# Patient Record
Sex: Male | Born: 2000 | Race: White | Hispanic: No | Marital: Married | Smoking: Never smoker
Health system: Southern US, Community
[De-identification: ages and names within clinical notes are randomized; demographics above are authoritative.]

## PROBLEM LIST (undated history)

## (undated) DIAGNOSIS — K219 Gastro-esophageal reflux disease without esophagitis: Secondary | ICD-10-CM

## (undated) DIAGNOSIS — F419 Anxiety disorder, unspecified: Secondary | ICD-10-CM

## (undated) DIAGNOSIS — A770 Spotted fever due to Rickettsia rickettsii: Secondary | ICD-10-CM

## (undated) HISTORY — PX: TEAR DUCT PROBING: SHX793

---
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
## 2018-02-16 ENCOUNTER — Emergency Department: Payer: Managed Care, Other (non HMO)

## 2018-02-16 ENCOUNTER — Emergency Department
Admission: EM | Admit: 2018-02-16 | Discharge: 2018-02-16 | Disposition: A | Discharge status: Home / Self Care | Payer: Managed Care, Other (non HMO) | Attending: Emergency Medicine | Admitting: Emergency Medicine

## 2018-02-16 DIAGNOSIS — Y999 Unspecified external cause status: Secondary | ICD-10-CM | POA: Insufficient documentation

## 2018-02-16 DIAGNOSIS — Y92410 Unspecified street and highway as the place of occurrence of the external cause: Secondary | ICD-10-CM | POA: Insufficient documentation

## 2018-02-16 DIAGNOSIS — Y939 Activity, unspecified: Secondary | ICD-10-CM | POA: Insufficient documentation

## 2018-02-16 DIAGNOSIS — M79605 Pain in left leg: Secondary | ICD-10-CM | POA: Insufficient documentation

## 2018-02-16 DIAGNOSIS — Z79899 Other long term (current) drug therapy: Secondary | ICD-10-CM | POA: Diagnosis not present

## 2018-02-16 DIAGNOSIS — S92512A Displaced fracture of proximal phalanx of left lesser toe(s), initial encounter for closed fracture: Secondary | ICD-10-CM | POA: Insufficient documentation

## 2018-02-16 DIAGNOSIS — S99922A Unspecified injury of left foot, initial encounter: Secondary | ICD-10-CM | POA: Diagnosis present

## 2018-02-16 MED ORDER — HYDROCODONE-ACETAMINOPHEN 5-325 MG PO TABS: 1.0000 | ORAL_TABLET | ORAL | 0 refills | Status: DC | PRN

## 2018-02-16 MED ORDER — HYDROCODONE-ACETAMINOPHEN 5-325 MG PO TABS
1.0000 | ORAL_TABLET | Freq: Once | ORAL | Status: AC
  Administered 2018-02-16: 1 via ORAL
  Filled 2018-02-16: qty 1
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
## 2018-02-16 NOTE — ED Notes (Signed)
Pt discharged to home.  Family member driving.  Discharge instructions reviewed.  Verbalized understanding.  No questions or concerns at this time.  Teach back verified.  Pt in NAD.  No items left in ED.   
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
## 2020-08-15 ENCOUNTER — Emergency Department: Payer: Managed Care, Other (non HMO)

## 2020-08-15 ENCOUNTER — Emergency Department
Admission: EM | Admit: 2020-08-15 | Discharge: 2020-08-15 | Disposition: A | Discharge status: Home / Self Care | Payer: Managed Care, Other (non HMO) | Attending: Emergency Medicine | Admitting: Emergency Medicine

## 2020-08-15 ENCOUNTER — Encounter: Payer: Self-pay | Admitting: Intensive Care

## 2020-08-15 ENCOUNTER — Other Ambulatory Visit: Payer: Self-pay | Admitting: Radiology

## 2020-08-15 ENCOUNTER — Other Ambulatory Visit: Payer: Self-pay

## 2020-08-15 DIAGNOSIS — R112 Nausea with vomiting, unspecified: Secondary | ICD-10-CM | POA: Diagnosis not present

## 2020-08-15 DIAGNOSIS — F17220 Nicotine dependence, chewing tobacco, uncomplicated: Secondary | ICD-10-CM | POA: Diagnosis not present

## 2020-08-15 DIAGNOSIS — R1031 Right lower quadrant pain: Secondary | ICD-10-CM | POA: Insufficient documentation

## 2020-08-15 DIAGNOSIS — K2900 Acute gastritis without bleeding: Secondary | ICD-10-CM | POA: Diagnosis not present

## 2020-08-15 DIAGNOSIS — R109 Unspecified abdominal pain: Secondary | ICD-10-CM

## 2020-08-15 DIAGNOSIS — N5082 Scrotal pain: Secondary | ICD-10-CM

## 2020-08-15 DIAGNOSIS — N50811 Right testicular pain: Secondary | ICD-10-CM | POA: Diagnosis present

## 2020-08-15 HISTORY — DX: Anxiety disorder, unspecified: F41.9

## 2020-08-15 LAB — COMPREHENSIVE METABOLIC PANEL
ALT: 20 U/L (ref 0–44)
AST: 18 U/L (ref 15–41)
Albumin: 4.2 g/dL (ref 3.5–5.0)
Alkaline Phosphatase: 48 U/L (ref 38–126)
Anion gap: 12 (ref 5–15)
BUN: 10 mg/dL (ref 6–20)
CO2: 25 mmol/L (ref 22–32)
Calcium: 8.9 mg/dL (ref 8.9–10.3)
Chloride: 104 mmol/L (ref 98–111)
Creatinine, Ser: 0.94 mg/dL (ref 0.61–1.24)
GFR calc Af Amer: >60 mL/min (ref >60)
GFR calc non Af Amer: >60 mL/min (ref >60)
Glucose, Bld: 96 mg/dL (ref 70–99)
Potassium: 3.4 mmol/L — ABNORMAL LOW (ref 3.5–5.1)
Sodium: 141 mmol/L (ref 135–145)
Total Bilirubin: 0.9 mg/dL (ref 0.3–1.2)
Total Protein: 7 g/dL (ref 6.5–8.1)

## 2020-08-15 LAB — CBC WITH DIFFERENTIAL/PLATELET
Abs Immature Granulocytes: 0.03 10*3/uL (ref 0.00–0.07)
Basophils Absolute: 0 10*3/uL (ref 0.0–0.1)
Basophils Relative: 1 %
Eosinophils Absolute: 0.2 10*3/uL (ref 0.0–0.5)
Eosinophils Relative: 2 %
HCT: 40 % (ref 39.0–52.0)
Hemoglobin: 13.9 g/dL (ref 13.0–17.0)
Immature Granulocytes: 0 %
Lymphocytes Relative: 35 %
Lymphs Abs: 2.9 10*3/uL (ref 0.7–4.0)
MCH: 31.2 pg (ref 26.0–34.0)
MCHC: 34.8 g/dL (ref 30.0–36.0)
MCV: 89.7 fL (ref 80.0–100.0)
Monocytes Absolute: 0.7 10*3/uL (ref 0.1–1.0)
Monocytes Relative: 8 %
Neutro Abs: 4.5 10*3/uL (ref 1.7–7.7)
Neutrophils Relative %: 54 %
Platelets: 180 10*3/uL (ref 150–400)
RBC: 4.46 MIL/uL (ref 4.22–5.81)
RDW: 12.5 % (ref 11.5–15.5)
WBC: 8.3 10*3/uL (ref 4.0–10.5)
nRBC: 0 % (ref 0.0–0.2)

## 2020-08-15 LAB — URINALYSIS, COMPLETE (UACMP) WITH MICROSCOPIC
Bacteria, UA: NONE SEEN
Bilirubin Urine: NEGATIVE
Glucose, UA: NEGATIVE mg/dL
Hgb urine dipstick: NEGATIVE
Ketones, ur: 20 mg/dL — AB
Leukocytes,Ua: NEGATIVE
Nitrite: NEGATIVE
Protein, ur: NEGATIVE mg/dL
Specific Gravity, Urine: 1.018 (ref 1.005–1.030)
Squamous Epithelial / LPF: NONE SEEN (ref 0–5)
pH: 6 (ref 5.0–8.0)

## 2020-08-15 MED ORDER — KETOROLAC TROMETHAMINE 30 MG/ML IJ SOLN
15.0000 mg | Freq: Once | INTRAMUSCULAR | Status: AC
Start: 2020-08-15 — End: 2020-08-15
  Administered 2020-08-15: 19:00:00 15 mg via INTRAVENOUS
  Filled 2020-08-15: qty 1

## 2020-08-15 MED ORDER — ONDANSETRON 4 MG PO TBDP: 4.0000 mg | ORAL_TABLET | Freq: Three times a day (TID) | ORAL | 0 refills | Status: AC | PRN

## 2020-08-15 MED ORDER — PANTOPRAZOLE SODIUM 40 MG PO TBEC: 40.0000 mg | DELAYED_RELEASE_TABLET | Freq: Two times a day (BID) | ORAL | 0 refills | Status: AC

## 2020-08-15 MED ORDER — IOHEXOL 300 MG/ML  SOLN
100.0000 mL | Freq: Once | INTRAMUSCULAR | Status: AC | PRN
  Administered 2020-08-15: 20:00:00 100 mL via INTRAVENOUS
  Filled 2020-08-15: qty 100

## 2020-08-15 MED ORDER — METHOCARBAMOL 500 MG PO TABS: 500.0000 mg | ORAL_TABLET | Freq: Three times a day (TID) | ORAL | 0 refills | Status: AC | PRN

## 2020-08-15 MED ORDER — SODIUM CHLORIDE 0.9 % IV BOLUS
1000.0000 mL | Freq: Once | INTRAVENOUS | Status: AC
  Administered 2020-08-15: 19:00:00 1000 mL via INTRAVENOUS

## 2020-08-15 MED ORDER — SUCRALFATE 1 G PO TABS: 1.0000 g | ORAL_TABLET | Freq: Four times a day (QID) | ORAL | 0 refills | Status: AC

## 2020-08-15 NOTE — ED Triage Notes (Signed)
Patient c/o right side pain that radiates down to right tesicle, causing testicle pain. Denies swelling. Reports urinary difficulties. [facility] sent patient here  With concern for possible pinched nerves in low back causing abdominal and groin pain.

## 2020-08-15 NOTE — ED Provider Notes (Signed)
[facility] Emergency Department Provider Note  ____________________________________________   First MD Initiated Contact with Patient 08/15/20 1732     (approximate)  I have reviewed the triage vital signs and the nursing notes.   HISTORY  Chief Complaint Testicle Pain (right)    HPI Seth Scott is a 19 y.o. male  With PMHx below here with right sided abd pain. Pt reports that over the past 3 weeks, he's had ongoing, worsening R flank and RUQ abd pain, radiating towards his RLQ and flank. He's had associated urinary frequency and urgency, as well as weight loss due to poor appetite. He states he has had intermittent n/v, btu often in the AMs. No constipation but he feels his BM have been looser than usual. No recent med changes. No recent travel. Has not tried anything or seen anyone for this.        Past Medical History:  Diagnosis Date  . Anxiety   . GERD (gastroesophageal reflux disease)   . [facility] spotted fever     There are no problems to display for this patient.   Past Surgical History:  Procedure Laterality Date  . TEAR DUCT PROBING      Prior to Admission medications   Medication Sig Start Date End Date Taking? Authorizing Provider  diazepam (VALIUM) 5 MG tablet Take 1 tablet (5 mg total) by mouth every 8 (eight) hours as needed for anxiety. Patient not taking: Reported on 02/16/2018 06/07/15   Emily Filbert, MD  HYDROcodone-acetaminophen (NORCO/VICODIN) 5-325 MG tablet Take 1 tablet by mouth every 4 (four) hours as needed. 02/16/18   Minna Antis, MD  methocarbamol (ROBAXIN) 500 MG tablet Take 1 tablet (500 mg total) by mouth every 8 (eight) hours as needed for up to 7 days for muscle spasms (or flank pain). 08/15/20 08/22/20  Shaune Pollack, MD  ondansetron (ZOFRAN ODT) 4 MG disintegrating tablet Take 1 tablet (4 mg total) by mouth every 8 (eight) hours as needed for nausea or vomiting. 08/15/20   Shaune Pollack, MD  pantoprazole (PROTONIX) 40 MG tablet Take 1 tablet (40 mg total) by mouth 2 (two) times daily for 7 days. 08/15/20 08/22/20  Shaune Pollack, MD  propranolol (INDERAL) 20 MG tablet Take 20 mg by mouth 2 (two) times daily. 02/04/18   [provider]  QUEtiapine (SEROQUEL) 200 MG tablet Take 200 mg by mouth at bedtime. 02/04/18   [provider]  sertraline (ZOLOFT) 100 MG tablet Take 100 mg by mouth daily. 02/04/18   [provider]  sucralfate (CARAFATE) 1 g tablet Take 1 tablet (1 g total) by mouth 4 (four) times daily for 7 days. 08/15/20 08/22/20  Shaune Pollack, MD    Allergies Prevacid [lansoprazole], Valium [diazepam], and Sulfa antibiotics  History reviewed. No pertinent family history.  Social History Social History   Tobacco Use  . Smoking status: Never Smoker  . Smokeless tobacco: Current User    Types: Snuff  Substance Use Topics  . Alcohol use: Yes    Comment: rare  . Drug use: Yes    Types: Marijuana    Review of Systems  Review of Systems  Constitutional: Positive for fatigue. Negative for chills and fever.  HENT: Negative for sore throat.   Respiratory: Negative for shortness of breath.   Cardiovascular: Negative for chest pain.  Gastrointestinal: Positive for abdominal pain, nausea and vomiting.  Genitourinary: Positive for dysuria, flank pain and frequency.  Musculoskeletal: Negative for neck pain.  Skin: Negative for rash and wound.  Allergic/Immunologic: Negative for immunocompromised state.  Neurological: Negative for weakness and numbness.  Hematological: Does not bruise/bleed easily.  All other systems reviewed and are negative.    ____________________________________________  PHYSICAL EXAM:      VITAL SIGNS: ED Triage Vitals  Enc Vitals Group     BP 08/15/20 1556 (!) 163/77     Pulse Rate 08/15/20 1556 66     Resp 08/15/20 1556 18     Temp 08/15/20 1556 98.6 F (37 C)     Temp Source 08/15/20 1556 Oral      SpO2 08/15/20 1556 100 %     Weight 08/15/20 1558 146 lb (66.2 kg)     Height 08/15/20 1558 5\' 11"  (1.803 m)     Head Circumference --      Peak Flow --      Pain Score 08/15/20 1557 4     Pain Loc --      Pain Edu? --      Excl. in GC? --      Physical Exam Vitals and nursing note reviewed.  Constitutional:      General: He is not in acute distress.    Appearance: He is well-developed.  HENT:     Head: Normocephalic and atraumatic.  Eyes:     Conjunctiva/sclera: Conjunctivae normal.  Cardiovascular:     Rate and Rhythm: Normal rate and regular rhythm.     Heart sounds: Normal heart sounds. No murmur heard.  No friction rub.  Pulmonary:     Effort: Pulmonary effort is normal. No respiratory distress.     Breath sounds: Normal breath sounds. No wheezing or rales.  Abdominal:     General: Abdomen is flat. There is no distension.     Palpations: Abdomen is soft.     Tenderness: There is abdominal tenderness (RUQ, RLQ).  Musculoskeletal:     Cervical back: Neck supple.  Skin:    General: Skin is warm.     Capillary Refill: Capillary refill takes less than 2 seconds.  Neurological:     Mental Status: He is alert and oriented to person, place, and time.     Motor: No abnormal muscle tone.       ____________________________________________   LABS (all labs ordered are listed, but only abnormal results are displayed)  Labs Reviewed  URINALYSIS, COMPLETE (UACMP) WITH MICROSCOPIC - Abnormal; Notable for the following components:      Result Value   Color, Urine YELLOW (*)    APPearance CLEAR (*)    Ketones, ur 20 (*)    All other components within normal limits  COMPREHENSIVE METABOLIC PANEL - Abnormal; Notable for the following components:   Potassium 3.4 (*)    All other components within normal limits  CBC WITH DIFFERENTIAL/PLATELET  HIV ANTIBODY (ROUTINE TESTING W REFLEX)    ____________________________________________  EKG:  None ________________________________________  RADIOLOGY All imaging, including plain films, CT scans, and ultrasounds, independently reviewed by me, and interpretations confirmed via formal radiology reads.  ED MD interpretation:   None  Official radiology report(s): CT ABDOMEN PELVIS W CONTRAST  Result Date: 08/15/2020 CLINICAL DATA:  Right-sided flank pain. Nausea and vomiting. Urinary difficulties. EXAM: CT ABDOMEN AND PELVIS WITH CONTRAST TECHNIQUE: Multidetector CT imaging of the abdomen and pelvis was performed using the standard protocol following bolus administration of intravenous contrast. CONTRAST:  08/17/2020 OMNIPAQUE IOHEXOL 300 MG/ML  SOLN COMPARISON:  None. FINDINGS: Lower chest: Lung bases are clear. Hepatobiliary:  No focal liver abnormality is seen. No gallstones, gallbladder wall thickening, or biliary dilatation. Pancreas: Unremarkable. No pancreatic ductal dilatation or surrounding inflammatory changes. Spleen: Normal in size without focal abnormality. Adrenals/Urinary Tract: No adrenal gland nodules. Kidneys are symmetrical with homogeneous nephrograms. No hydronephrosis or hydroureter. Bladder is decompressed. Stomach/Bowel: Stomach is within normal limits. Appendix appears normal. No evidence of bowel wall thickening, distention, or inflammatory changes. Vascular/Lymphatic: No significant vascular findings are present. No enlarged abdominal or pelvic lymph nodes. Reproductive: Prostate gland is not enlarged. Other: Minimal free fluid in the pelvis of nonspecific etiology. Calcified phleboliths. No free air. Abdominal wall musculature appears intact. Musculoskeletal: Minimal spondylolisthesis at L5-S1. IMPRESSION: No acute process demonstrated in the abdomen or pelvis. No evidence of bowel obstruction or inflammation. Minimal free fluid in the pelvis of nonspecific etiology. Electronically Signed   By: Burman NievesWilliam  Stevens M.D.   On: 08/15/2020 20:08   US SCROTUM W/DOPPLER  Result  Date: 08/15/2020 CLINICAL DATA:  Right-sided testicular pain for 1 day. EXAM: SCROTAL ULTRASOUND DOPPLER ULTRASOUND OF THE TESTICLES TECHNIQUE: Complete ultrasound examination of the testicles, epididymis, and other scrotal structures was performed. Color and spectral Doppler ultrasound were also utilized to evaluate blood flow to the testicles. COMPARISON:  None. FINDINGS: Right testicle Measurements: 4.6 x 2.3 x 3.0 cm. Symmetric and homogeneous echotexture without focal lesion. Patent arterial and venous blood flow. Left testicle Measurements: 4.7 x 2.4 x 3.3 cm. Symmetric and homogeneous echotexture without focal lesion. Patent arterial and venous blood flow. Right epididymis:  Normal in size and appearance. Left epididymis:  Normal in size and appearance. Hydrocele:  None visualized. Varicocele:  None visualized. Pulsed Doppler interrogation of both testes demonstrates normal low resistance arterial and venous waveforms bilaterally. IMPRESSION: Normal scrotal ultrasound examination. No testicular mass lesions and patent intratesticular blood flow bilaterally. Electronically Signed   By: Rudie MeyerP.  Gallerani M.D.   On: 08/15/2020 16:59    ____________________________________________  PROCEDURES   Procedure(s) performed (including Critical Care):  Procedures  ____________________________________________  INITIAL IMPRESSION / MDM / ASSESSMENT AND PLAN / ED COURSE  As part of my medical decision making, I reviewed the following data within the electronic MEDICAL RECORD NUMBER Nursing notes reviewed and incorporated, Old chart reviewed, Notes from prior ED visits, and [place] Controlled Substance Database       *Clista BernhardtChase Daniel Barna was evaluated in Emergency Department on 08/16/2020 for the symptoms described in the history of present illness. He was evaluated in the context of the global COVID-19 pandemic, which necessitated consideration that the patient might be at risk for infection with the SARS-CoV-2 virus  that causes COVID-19. Institutional protocols and algorithms that pertain to the evaluation of patients at risk for COVID-19 are in a state of rapid change based on information released by regulatory bodies including the CDC and federal and state organizations. These policies and algorithms were followed during the patient's care in the ED.  Some ED evaluations and interventions may be delayed as a result of limited staffing during the pandemic.*     Medical Decision Making:  19 yo  M here with R flank pain radiating towards groin, as well as several months of intermittent n/v with waking up and worsening reflux. HDS and well appearing on arrival. Re: flank pain, suspect this is MSK in etiology and it began after he fell at home. He has no radiation down his legs, no perianal or perineal anesthesia, no loss of bowel/bladder fxn, or other signs of cauda equina or central cord  compression. UA without hematuria, pyuria. He is mildly dehydrated which could explain why he has a slight discomfort w/ urinating at times. CT scan obtained shows no signs of stone, appendicitis, mass, or other abnormality. Will tx supportively.  Regarding pt's nausea and burning chest discomfort, suspect GERD with possible underlying PUD. No signs of bleeding. He has a chronic h/o similar sx. Will refer to GI as he c/o n/v every AM, discomfort, and even some weight loss.   ____________________________________________  FINAL CLINICAL IMPRESSION(S) / ED DIAGNOSES  Final diagnoses:  Acute superficial gastritis without hemorrhage  Flank pain     MEDICATIONS GIVEN DURING THIS VISIT:  Medications  sodium chloride 0.9 % bolus 1,000 mL (0 mLs Intravenous Stopped 08/15/20 2137)  ketorolac (TORADOL) 30 MG/ML injection 15 mg (15 mg Intravenous Given 08/15/20 1848)  iohexol (OMNIPAQUE) 300 MG/ML solution 100 mL (100 mLs Intravenous Contrast Given 08/15/20 1950)     ED Discharge Orders         Ordered    pantoprazole (PROTONIX) 40  MG tablet  2 times daily        08/15/20 2128    sucralfate (CARAFATE) 1 g tablet  4 times daily        08/15/20 2128    methocarbamol (ROBAXIN) 500 MG tablet  Every 8 hours PRN        08/15/20 2128    ondansetron (ZOFRAN ODT) 4 MG disintegrating tablet  Every 8 hours PRN        08/15/20 2129    Ambulatory referral to Gastroenterology        08/15/20 2130           Note:  This document was prepared using Dragon voice recognition software and may include unintentional dictation errors.   Shaune Pollack, MD 08/16/20 [phone]

## 2020-08-15 NOTE — ED Notes (Signed)
Pt reports pain in right lower back pain that radiates into right lower abd into right testicle Pt has had N/V x2-3 days only in the morning

## 2020-08-16 LAB — HIV ANTIBODY (ROUTINE TESTING W REFLEX): HIV Screen 4th Generation wRfx: NONREACTIVE

## 2020-10-03 ENCOUNTER — Other Ambulatory Visit: Payer: Self-pay

## 2020-10-08 ENCOUNTER — Encounter: Payer: Self-pay | Admitting: *Deleted

## 2020-10-08 ENCOUNTER — Ambulatory Visit: Payer: Managed Care, Other (non HMO) | Admitting: Gastroenterology

## 2020-10-08 NOTE — Progress Notes (Deleted)
Gastroenterology Consultation  Referring Provider:     Shaune Pollack, MD Primary Care Physician:  Armando Gang, FNP Primary Gastroenterologist:  Dr. Servando Snare     Reason for Consultation:     Nausea and vomiting        HPI:   Seth Scott is a 19 y.o. y/o male referred for consultation & management of nausea and vomiting by Dr. Clint Guy, Fernand Parkins, FNP.  This patient comes to see me after being seen in May of last year at [facility] GI.  At that time the patient had reported to have an unremarkable CBC, CMP, lipid panel, thyroid profile, Neg H pylori IgM Ab, neg celiac disease panel, neg GI PCR, and neg Crohn's IBDX Prognostic panel.  The patient was recommended to follow-up in 6 weeks with the concern of possible Crohn's despite the above negative tests reported.  It appears that the patient did not follow-up at that time with [facility] clinic and also did not undergo the recommended colonoscopy.  The patient was in the emergency room with testicular pain in September and had mentioned some chest discomfort with morning nausea and vomiting therefore a GI consult was placed with me today.  In the emergency room room the patient was also reporting some abdominal discomfort with flank pain on the right.   Past Medical History:  Diagnosis Date  . Anxiety   . GERD (gastroesophageal reflux disease)   . [place] spotted fever     Past Surgical History:  Procedure Laterality Date  . TEAR DUCT PROBING      Prior to Admission medications   Medication Sig Start Date End Date Taking? Authorizing Provider  ondansetron (ZOFRAN ODT) 4 MG disintegrating tablet Take 1 tablet (4 mg total) by mouth every 8 (eight) hours as needed for nausea or vomiting. 08/15/20   Shaune Pollack, MD  pantoprazole (PROTONIX) 40 MG tablet Take 1 tablet (40 mg total) by mouth 2 (two) times daily for 7 days. 08/15/20 08/22/20  Shaune Pollack, MD  QUEtiapine (SEROQUEL) 200 MG tablet Take 1 tablet by mouth  at bedtime. 08/08/18   [provider]  sucralfate (CARAFATE) 1 g tablet Take 1 tablet (1 g total) by mouth 4 (four) times daily for 7 days. 08/15/20 08/22/20  Shaune Pollack, MD    No family history on file.   Social History   Tobacco Use  . Smoking status: Never Smoker  . Smokeless tobacco: Current User    Types: Snuff  Substance Use Topics  . Alcohol use: Yes    Comment: rare  . Drug use: Yes    Types: Marijuana    Allergies as of 10/08/2020 - Review Complete 08/15/2020  Allergen Reaction Noted  . Prevacid [lansoprazole] Swelling 06/07/2015  . Valium [diazepam] Hives 02/16/2018  . Sulfa antibiotics Rash 06/07/2015  . Sulfur Rash 06/10/2015    Review of Systems:    All systems reviewed and negative except where noted in HPI.   Physical Exam:  There were no vitals taken for this visit. No LMP for male patient. General:   Alert,  Well-developed, well-nourished, pleasant and cooperative in NAD Head:  Normocephalic and atraumatic. Eyes:  Sclera clear, no icterus.   Conjunctiva pink. Ears:  Normal auditory acuity. Neck:  Supple; no masses or thyromegaly. Lungs:  Respirations even and unlabored.  Clear throughout to auscultation.   No wheezes, crackles, or rhonchi. No acute distress. Heart:  Regular rate and rhythm; no murmurs, clicks, rubs, or gallops. Abdomen:  Normal bowel sounds.  No bruits.  Soft, non-tender and non-distended without masses, hepatosplenomegaly or hernias noted.  No guarding or rebound tenderness.  Negative Carnett sign.   Rectal:  Deferred.  Pulses:  Normal pulses noted. Extremities:  No clubbing or edema.  No cyanosis. Neurologic:  Alert and oriented x3;  grossly normal neurologically. Skin:  Intact without significant lesions or rashes.  No jaundice. Lymph Nodes:  No significant cervical adenopathy. Psych:  Alert and cooperative. Normal mood and affect.  Imaging Studies: No results found.  Assessment and Plan:   Seth Scott is a  19 y.o. y/o male ***    Midge Minium, MD. Clementeen Graham    Note: This dictation was prepared with Dragon dictation along with smaller phrase technology. Any transcriptional errors that result from this process are unintentional.

## 2020-11-18 ENCOUNTER — Emergency Department
Admission: EM | Admit: 2020-11-18 | Discharge: 2020-11-18 | Disposition: A | Discharge status: Home / Self Care | Payer: Managed Care, Other (non HMO) | Attending: Emergency Medicine | Admitting: Emergency Medicine

## 2020-11-18 ENCOUNTER — Other Ambulatory Visit: Payer: Self-pay

## 2020-11-18 ENCOUNTER — Emergency Department: Payer: Managed Care, Other (non HMO)

## 2020-11-18 DIAGNOSIS — S6991XA Unspecified injury of right wrist, hand and finger(s), initial encounter: Secondary | ICD-10-CM | POA: Diagnosis present

## 2020-11-18 DIAGNOSIS — S6701XA Crushing injury of right thumb, initial encounter: Secondary | ICD-10-CM | POA: Insufficient documentation

## 2020-11-18 DIAGNOSIS — W208XXA Other cause of strike by thrown, projected or falling object, initial encounter: Secondary | ICD-10-CM | POA: Insufficient documentation

## 2020-11-18 NOTE — ED Triage Notes (Signed)
Pt arrives via pov, ambulatory to treatment room to be triaged. Pt  reports a piece of wood weighing around 200 fell onto pt thumb pinning it in place for several mins until family member was able to remove weight to get thumb out.

## 2020-11-18 NOTE — ED Provider Notes (Signed)
[facility] Emergency Department Provider Note  ____________________________________________   Event Date/Time   First MD Initiated Contact with Patient 11/18/20 1010     (approximate)  I have reviewed the triage vital signs and the nursing notes.   HISTORY  Chief Complaint Hand Injury    HPI Seth Scott is a 19 y.o. male presents emergency department with right hand injury prior to arrival.  States a 200 pound piece of wood fell on his right thumb.  Patient states that someone outside lifted off of his hand.  No other injuries reported.   Tdap is up-to-date.   Past Medical History:  Diagnosis Date  . Anxiety   . GERD (gastroesophageal reflux disease)   . [place] spotted fever     Patient Active Problem List   Diagnosis Date Noted  . Anxiety 07/12/2016  . Benzodiazepine misuse 07/12/2016  . Foreign body ingestion 07/10/2016    Past Surgical History:  Procedure Laterality Date  . TEAR DUCT PROBING      Prior to Admission medications   Medication Sig Start Date End Date Taking? Authorizing Provider  ondansetron (ZOFRAN ODT) 4 MG disintegrating tablet Take 1 tablet (4 mg total) by mouth every 8 (eight) hours as needed for nausea or vomiting. 08/15/20   Shaune Pollack, MD  pantoprazole (PROTONIX) 40 MG tablet Take 1 tablet (40 mg total) by mouth 2 (two) times daily for 7 days. 08/15/20 08/22/20  Shaune Pollack, MD  QUEtiapine (SEROQUEL) 200 MG tablet Take 1 tablet by mouth at bedtime. 08/08/18   [provider]  sucralfate (CARAFATE) 1 g tablet Take 1 tablet (1 g total) by mouth 4 (four) times daily for 7 days. 08/15/20 08/22/20  Shaune Pollack, MD    Allergies Prevacid [lansoprazole], Valium [diazepam], Sulfa antibiotics, and Sulfur  History reviewed. No pertinent family history.  Social History Social History   Tobacco Use  . Smoking status: Never Smoker  . Smokeless tobacco: Current User    Types: Snuff   Substance Use Topics  . Alcohol use: Yes    Comment: rare  . Drug use: Yes    Types: Marijuana    Review of Systems  Constitutional: No fever/chills Eyes: No visual changes. ENT: No sore throat. Respiratory: Denies cough Genitourinary: Negative for dysuria. Musculoskeletal: Negative for back pain.  Right thumb pain Skin: Negative for rash. Psychiatric: no mood changes,     ____________________________________________   PHYSICAL EXAM:  VITAL SIGNS: ED Triage Vitals  Enc Vitals Group     BP 11/18/20 0954 118/80     Pulse Rate 11/18/20 0954 92     Resp 11/18/20 0954 16     Temp 11/18/20 0954 98.3 F (36.8 C)     Temp Source 11/18/20 0954 Oral     SpO2 11/18/20 0954 99 %     Weight 11/18/20 0948 150 lb (68 kg)     Height 11/18/20 0948 6' (1.829 m)     Head Circumference --      Peak Flow --      Pain Score 11/18/20 0948 5     Pain Loc --      Pain Edu? --      Excl. in GC? --     Constitutional: Alert and oriented. Well appearing and in no acute distress. Eyes: Conjunctivae are normal.  Head: Atraumatic. Nose: No congestion/rhinnorhea. Mouth/Throat: Mucous membranes are moist.  Neck:  supple no lymphadenopathy noted Cardiovascular: Normal rate, regular rhythm. Respiratory: Normal respiratory effort.  No retractions GU: deferred Musculoskeletal: FROM all extremities, warm and well perfused, right thumb is tender to palpation, neurovascular is intact, full range of motion Neurologic:  Normal speech and language.  Skin:  Skin is warm, dry dry, small area of blood noted but no open wound on the right thumb. No rash noted. Psychiatric: Mood and affect are normal. Speech and behavior are normal.  ____________________________________________   LABS (all labs ordered are listed, but only abnormal results are displayed)  Labs Reviewed - No data to  display ____________________________________________   ____________________________________________  RADIOLOGY  X-ray of the right thumb is negative  ____________________________________________   PROCEDURES  Procedure(s) performed: No  Procedures    ____________________________________________   INITIAL IMPRESSION / ASSESSMENT AND PLAN / ED COURSE  Pertinent labs & imaging results that were available during my care of the patient were reviewed by me and considered in my medical decision making (see chart for details).   Pt is a 19 y/o male presents with right thumb injury, see hpi, physical exam is consistent with crush injury vs fracture  Xray right thumb is negative, also confirmed by radiologist   Explained the findings to the patient.  Patient was placed in thumb spica premade splint.  He is to f/u with orthopedics if not improving in 2 - 3 days.  REturn to the ER if worsening.  He was discharged in stable condition     Seth Scott was evaluated in Emergency Department on 11/18/2020 for the symptoms described in the history of present illness. He was evaluated in the context of the global COVID-19 pandemic, which necessitated consideration that the patient might be at risk for infection with the SARS-CoV-2 virus that causes COVID-19. Institutional protocols and algorithms that pertain to the evaluation of patients at risk for COVID-19 are in a state of rapid change based on information released by regulatory bodies including the CDC and federal and state organizations. These policies and algorithms were followed during the patient's care in the ED.    As part of my medical decision making, I reviewed the following data within the electronic MEDICAL RECORD NUMBER Nursing notes reviewed and incorporated, Old chart reviewed, Radiograph reviewed , Notes from prior ED visits and [place] Controlled Substance Database  ____________________________________________   FINAL  CLINICAL IMPRESSION(S) / ED DIAGNOSES  Final diagnoses:  Crushing injury of right thumb, initial encounter      NEW MEDICATIONS STARTED DURING THIS VISIT:  New Prescriptions   No medications on file     Note:  This document was prepared using Dragon voice recognition software and may include unintentional dictation errors.    Faythe Ghee, PA-C 11/18/20 1042    Jene Every, MD 11/18/20 1051

## 2020-11-18 NOTE — Discharge Instructions (Addendum)
Wear the splint to protect the finger.  Follow-up with orthopedics.  Apply ice and elevate your hand.  Return emergency department if you are worsening.  Take over-the-counter Tylenol and ibuprofen for pain as needed.

## 2024-05-24 ENCOUNTER — Other Ambulatory Visit: Payer: Self-pay

## 2024-05-24 DIAGNOSIS — L500 Allergic urticaria: Secondary | ICD-10-CM | POA: Insufficient documentation

## 2024-05-24 DIAGNOSIS — T7840XA Allergy, unspecified, initial encounter: Secondary | ICD-10-CM | POA: Diagnosis present

## 2024-05-24 NOTE — ED Triage Notes (Addendum)
 Pt reports hives and itching since Tuesday, pt was seen at [facility] yesterday and given rx for prednisone, pt reports no relief in rash. Pt denies sob. Pt took 50 mg benadryl aprox 1 hour ago

## 2024-05-25 ENCOUNTER — Emergency Department
Admission: EM | Admit: 2024-05-25 | Discharge: 2024-05-25 | Disposition: A | Discharge status: Home / Self Care | Payer: Self-pay | Attending: Emergency Medicine | Admitting: Emergency Medicine

## 2024-05-25 DIAGNOSIS — T7840XA Allergy, unspecified, initial encounter: Secondary | ICD-10-CM

## 2024-05-25 DIAGNOSIS — L509 Urticaria, unspecified: Secondary | ICD-10-CM

## 2024-05-25 MED ORDER — FAMOTIDINE 20 MG PO TABS
20.0000 mg | ORAL_TABLET | Freq: Once | ORAL | Status: AC
  Administered 2024-05-25: 20 mg via ORAL
  Filled 2024-05-25: qty 1

## 2024-05-25 NOTE — Discharge Instructions (Signed)
 You have been seen in the Emergency Department (ED) today for an allergic reaction.  We do not know what is causing your symptoms, but we encourage you to continue taking the full course of prednisone you are previously prescribed.  We recommend you try taking cetirizine 10 mg (over-the-counter) every 12 hours for the next few days, then you can switch to taking it just once a day.  We also recommend you take over-the-counter famotidine  20 mg according to label instructions.  This can also help reduce your hives.  Consider calling the office of Dr. Herminio to schedule a follow-up appointment to discuss allergy testing.  Return to the Emergency Department (ED) if you experience any worsening or new symptoms that concern you.

## 2024-05-25 NOTE — ED Provider Notes (Signed)
 [facility] Provider Note    Event Date/Time   First MD Initiated Contact with Patient 05/25/24 0021     (approximate)   History   Allergic Reaction   HPI Seth Scott is a 23 y.o. male who presents for evaluation of allergic reaction.  He said that for several days he has had intermittent episodes of scattered hives over his body, mostly on his arms and legs but occasional on his torso as well.  He went to an urgent care about a day ago and got a prescription for a Medrol Dosepak which he just started within the last 24 hours.  He thinks the symptoms are little bit better but then the hives started coming back tonight so he came to the emergency department.  He has also been taking Benadryl according to the label instructions.  He has not had any trouble breathing or any swelling of his face or neck.  No cough.  No nausea, vomiting, chest pain, shortness of breath, nor abdominal pain.  No vomiting or diarrhea.  No lightheadedness or dizziness.  He has no allergies of which she is aware.  He cannot think of anything new in his house including animals, chemicals, or products.     Physical Exam   Triage Vital Signs: ED Triage Vitals  Encounter Vitals Group     BP 05/24/24 2313 (!) 134/98     Girls Systolic BP Percentile --      Girls Diastolic BP Percentile --      Boys Systolic BP Percentile --      Boys Diastolic BP Percentile --      Pulse Rate 05/24/24 2313 88     Resp 05/24/24 2313 18     Temp 05/24/24 2313 99 F (37.2 C)     Temp src --      SpO2 05/24/24 2313 100 %     Weight 05/24/24 2312 61.7 kg (136 lb)     Height 05/24/24 2312 1.829 m (6')     Head Circumference --      Peak Flow --      Pain Score 05/24/24 2312 0     Pain Loc --      Pain Education --      Exclude from Growth Chart --     Most recent vital signs: Vitals:   05/24/24 2313  BP: (!) 134/98  Pulse: 88  Resp: 18  Temp: 99 F (37.2 C)  SpO2: 100%     General: Awake, no distress.  CV:  Good peripheral perfusion.  Resp:  Normal effort. Speaking easily and comfortably, no accessory muscle usage nor intercostal retractions.  No stridor. Abd:  No distention.  Other:  Scattered urticaria on his arms.  No mucosal involvement.   ED Results / Procedures / Treatments   Labs (all labs ordered are listed, but only abnormal results are displayed) Labs Reviewed - No data to display   PROCEDURES:  Critical Care performed: No  Procedures    IMPRESSION / MDM / ASSESSMENT AND PLAN / ED COURSE  I reviewed the triage vital signs and the nursing notes.                              Differential diagnosis includes, but is not limited to, allergic reaction, anaphylaxis, medication or drug side effect.  Patient's presentation is most consistent with acute, uncomplicated illness.   Interventions/Medications given:  Medications  famotidine  (PEPCID ) tablet 20 mg (20 mg Oral Given 05/25/24 0054)    (Note:  hospital course my include additional interventions and/or labs/studies not listed above.)   Patient is on prednisone but is only had 1 dose.  No evidence of anaphylaxis.  I provided reassurance, encouraged continued use of prednisone, recommended switching from Benadryl to cetirizine 10 mg twice daily for several days and then switching to 10 mg p.o. daily.  Provided a dose of famotidine  and encouraged taking famotidine  at home according to label instructions as well.  No indication for EpiPen.  Recommended outpatient follow-up for allergy testing.  Patient understands and agrees with the plan.         FINAL CLINICAL IMPRESSION(S) / ED DIAGNOSES   Final diagnoses:  Urticaria  Allergic reaction, initial encounter     Rx / DC Orders   ED Discharge Orders     None        Note:  This document was prepared using Dragon voice recognition software and may include unintentional dictation errors.   Gordan Huxley, MD 05/25/24  [id number]
